# Patient Record
Sex: Female | Born: 2000 | Hispanic: Yes | Marital: Single | Smoking: Never smoker
Health system: Southern US, Community
[De-identification: ages and names within clinical notes are randomized; demographics above are authoritative.]

## PROBLEM LIST (undated history)

## (undated) ENCOUNTER — Emergency Department (HOSPITAL_COMMUNITY): Admission: EM | Payer: BLUE CROSS/BLUE SHIELD | Source: Home / Self Care

## (undated) DIAGNOSIS — J849 Interstitial pulmonary disease, unspecified: Secondary | ICD-10-CM

## (undated) DIAGNOSIS — R21 Rash and other nonspecific skin eruption: Secondary | ICD-10-CM

## (undated) DIAGNOSIS — Z8709 Personal history of other diseases of the respiratory system: Secondary | ICD-10-CM

## (undated) DIAGNOSIS — K1379 Other lesions of oral mucosa: Secondary | ICD-10-CM

---
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
## 2023-01-28 ENCOUNTER — Inpatient Hospital Stay
Admission: RE | Admit: 2023-01-28 | Discharge: 2023-01-28 | Disposition: A | Discharge status: Home / Self Care | Payer: Self-pay | Source: Ambulatory Visit

## 2023-01-28 ENCOUNTER — Ambulatory Visit
Admission: EM | Admit: 2023-01-28 | Discharge: 2023-01-28 | Disposition: A | Discharge status: Home / Self Care | Payer: BLUE CROSS/BLUE SHIELD | Attending: Internal Medicine | Admitting: Internal Medicine

## 2023-01-28 ENCOUNTER — Ambulatory Visit
Admission: RE | Admit: 2023-01-28 | Discharge: 2023-01-28 | Disposition: A | Discharge status: Home / Self Care | Payer: BLUE CROSS/BLUE SHIELD | Source: Ambulatory Visit | Attending: Internal Medicine | Admitting: Internal Medicine

## 2023-01-28 DIAGNOSIS — J020 Streptococcal pharyngitis: Secondary | ICD-10-CM

## 2023-01-28 DIAGNOSIS — R053 Chronic cough: Secondary | ICD-10-CM | POA: Diagnosis present

## 2023-01-28 DIAGNOSIS — J069 Acute upper respiratory infection, unspecified: Secondary | ICD-10-CM | POA: Diagnosis present

## 2023-01-28 DIAGNOSIS — J029 Acute pharyngitis, unspecified: Secondary | ICD-10-CM | POA: Diagnosis present

## 2023-01-28 LAB — POCT RAPID STREP A (OFFICE): Rapid Strep A Screen: NEGATIVE

## 2023-01-28 MED ORDER — ALBUTEROL SULFATE HFA 108 (90 BASE) MCG/ACT IN AERS: 1.0000 | INHALATION_SPRAY | Freq: Four times a day (QID) | RESPIRATORY_TRACT | 0 refills | Status: AC | PRN

## 2023-01-28 MED ORDER — BENZONATATE 100 MG PO CAPS: 100.0000 mg | ORAL_CAPSULE | Freq: Three times a day (TID) | ORAL | 0 refills | Status: DC | PRN

## 2023-01-28 MED ORDER — DOXYCYCLINE HYCLATE 100 MG PO CAPS: 100.0000 mg | ORAL_CAPSULE | Freq: Two times a day (BID) | ORAL | 0 refills | Status: AC

## 2023-01-28 NOTE — Discharge Instructions (Signed)
Strep is negative.  Throat culture is pending.  Will call if it is abnormal.  I have prescribed an antibiotic, cough medication, inhaler to take for symptoms.  Chest x-ray has been ordered at [facility] imaging.  Please go there today to have x-ray completed.  Follow-up with the ER or urgent care if symptoms persist or worsen.

## 2023-01-28 NOTE — ED Provider Notes (Addendum)
[facility]    CSN: [id number] Arrival date & time: 01/28/23  1343      History   Chief Complaint Chief Complaint  Patient presents with   Appointment    1400   Cough    HPI Dominique Robles is a 22 y.o. female.   Patient presents with nasal congestion, sore throat, cough that has been present for about 2 to 3 weeks.  Patient denies any fever or known sick contacts.  Patient has taken over-the-counter Mucinex with minimal improvement in symptoms.  Patient also reporting some shortness of breath and wheezing that is intermittent.  Patient does have a history of interstitial lung disease.  Patient does not use any form of inhaler for the symptoms.  She denies that she smokes cigarettes but does report that she vapes occasionally.   Cough   Past Medical History:  Diagnosis Date   History of asthma    at a young age   Interstitial pneumonia (HCC)    with autoimmune features   Mucocele of lower lip 02/2017   Rash of back 03/04/2017   has appt. to see dermatologist    There are no problems to display for this patient.   Past Surgical History:  Procedure Laterality Date   CHEST TUBE INSERTION Left 11/13/2015   LUNG BIOPSY  11/13/2015   MASS EXCISION N/A 03/11/2017   Procedure: EXCISION MUCOCELE LOWER LIP MUCOSA;  Surgeon: Glenna Fellows, MD;  Location: [facility];  Service: Plastics;  Laterality: N/A;   VIDEO BRONCHOSCOPY WITH ENDOBRONCHIAL ULTRASOUND  07/31/2015    OB History   No obstetric history on file.      Home Medications    Prior to Admission medications   Medication Sig Start Date End Date Taking? Authorizing Provider  albuterol (VENTOLIN HFA) 108 (90 Base) MCG/ACT inhaler Inhale 1-2 puffs into the lungs every 6 (six) hours as needed for wheezing or shortness of breath. 01/28/23  Yes Collen Hostler, Rolly Salter E, FNP  benzonatate (TESSALON) 100 MG capsule Take 1 capsule (100 mg total) by mouth every 8 (eight) hours as needed for  cough. 01/28/23  Yes Channon Brougher, Rolly Salter E, FNP  dextromethorphan-guaiFENesin (MUCINEX DM) 30-600 MG 12hr tablet Take 1 tablet by mouth 2 (two) times daily.   Yes [provider]  doxycycline (VIBRAMYCIN) 100 MG capsule Take 1 capsule (100 mg total) by mouth 2 (two) times daily. 01/28/23  Yes Derris Millan, Rolly Salter E, FNP  etonogestrel (NEXPLANON) 68 MG IMPL implant 1 each by Subdermal route once.   Yes [provider]  azithromycin (ZITHROMAX) 250 MG tablet Take 1 tablet (250 mg total) by mouth daily. Take first 2 tablets together, then 1 every day until finished. 10/24/17   Vicki Mallet, MD  clindamycin (CLEOCIN) 300 MG capsule Take 1 capsule (300 mg total) by mouth 3 (three) times daily. For 5 days 07/11/17   Ree Shay, MD  folic acid (FOLVITE) 1 MG tablet Take 1 mg by mouth daily.    [provider]  hydroxychloroquine (PLAQUENIL) 200 MG tablet Take 200 mg by mouth daily.    [provider]  Lactobacillus Rhamnosus, GG, (CULTURELLE) CAPS Take 1 capsule by mouth 3 (three) times daily. For 5 days 07/11/17   Ree Shay, MD  Methotrexate, PF, 25 MG/0.4ML SOAJ Inject 25 mg into the skin.    [provider]  Multiple Vitamin (MULTIVITAMIN) tablet Take 1 tablet by mouth daily.    [provider]  oseltamivir (TAMIFLU) 75 MG capsule Take  1 capsule (75 mg total) by mouth every 12 (twelve) hours. 10/24/17   Vicki Mallet, MD    Family History Family History  Problem Relation Age of Onset   Diabetes Maternal Grandmother    Hypertension Maternal Grandmother    Diabetes Paternal Grandmother    Diabetes Paternal Grandfather     Social History Social History   Tobacco Use   Smoking status: Never   Smokeless tobacco: Never  Vaping Use   Vaping Use: Some days  Substance Use Topics   Alcohol use: No   Drug use: Yes    Frequency: 1.0 times per week    Types: Marijuana     Allergies   Keflex [cephalexin], Lactose intolerance (gi), Milk (cow), and  Penicillins   Review of Systems Review of Systems Per HPI  Physical Exam Triage Vital Signs ED Triage Vitals  Enc Vitals Group     BP 01/28/23 1409 133/82     Pulse Rate 01/28/23 1409 (!) 103     Resp 01/28/23 1409 18     Temp 01/28/23 1409 98.9 F (37.2 C)     Temp Source 01/28/23 1409 Oral     SpO2 01/28/23 1409 98 %     Weight --      Height --      Head Circumference --      Peak Flow --      Pain Score 01/28/23 1411 6     Pain Loc --      Pain Edu? --      Excl. in GC? --    No data found.  Updated Vital Signs BP 133/82 (BP Location: Left Arm)   Pulse (!) 103   Temp 98.9 F (37.2 C) (Oral)   Resp 18   SpO2 98%   Visual Acuity Right Eye Distance:   Left Eye Distance:   Bilateral Distance:    Right Eye Near:   Left Eye Near:    Bilateral Near:     Physical Exam Constitutional:      General: She is not in acute distress.    Appearance: Normal appearance. She is not toxic-appearing or diaphoretic.  HENT:     Head: Normocephalic and atraumatic.     Right Ear: Tympanic membrane and ear canal normal.     Left Ear: Tympanic membrane and ear canal normal.     Nose: Congestion present.     Mouth/Throat:     Mouth: Mucous membranes are moist.     Pharynx: Posterior oropharyngeal erythema present.  Eyes:     Extraocular Movements: Extraocular movements intact.     Conjunctiva/sclera: Conjunctivae normal.     Pupils: Pupils are equal, round, and reactive to light.  Cardiovascular:     Rate and Rhythm: Normal rate and regular rhythm.     Pulses: Normal pulses.     Heart sounds: Normal heart sounds.  Pulmonary:     Effort: Pulmonary effort is normal. No respiratory distress.     Breath sounds: Normal breath sounds. No stridor. No wheezing, rhonchi or rales.  Abdominal:     General: Abdomen is flat. Bowel sounds are normal.     Palpations: Abdomen is soft.  Musculoskeletal:        General: Normal range of motion.     Cervical back: Normal range of  motion.  Skin:    General: Skin is warm and dry.  Neurological:     General: No focal deficit present.     Mental Status:  She is alert and oriented to person, place, and time. Mental status is at baseline.  Psychiatric:        Mood and Affect: Mood normal.        Behavior: Behavior normal.      UC Treatments / Results  Labs (all labs ordered are listed, but only abnormal results are displayed) Labs Reviewed  CULTURE, GROUP A STREP [facility])  POCT RAPID STREP A (OFFICE)    EKG   Radiology DG Chest 2 View  Result Date: 01/28/2023 CLINICAL DATA:  Cough and congestion EXAM: CHEST - 2 VIEW COMPARISON:  Chest radiograph and CT 10/24/2017 FINDINGS: The cardiomediastinal silhouette is normal A 2.9 cm cavitary lesion in the left upper lobe with layering fluid is unchanged since 2019. There is no other focal airspace opacity. There is no pulmonary edema. There is no pleural effusion or pneumothorax There is no acute osseous abnormality. IMPRESSION: 1. No radiographic evidence of acute cardiopulmonary process. 2. Cavitary lesion in the left upper lobe with layering fluid is unchanged since 2019. Electronically Signed   By: Lesia Hausen M.D.   On: 01/28/2023 15:40    Procedures Procedures (including critical care time)  Medications Ordered in UC Medications - No data to display  Initial Impression / Assessment and Plan / UC Course  I have reviewed the triage vital signs and the nursing notes.  Pertinent labs & imaging results that were available during my care of the patient were reviewed by me and considered in my medical decision making (see chart for details).     I do think that chest imaging is necessary given patient's history of interstitial lung disease and recent shortness of breath.  Do not have x-ray tech here in urgent care today so outpatient imaging was ordered.  Awaiting results.  Patient is not tachypneic, no adventitious lung sounds on exam, and oxygen is normal so do not  think that emergent evaluation is necessary.  Symptoms most likely started off as a viral illness but given duration of symptoms, I am concerned for secondary bacterial infection so will opt to treat with doxycycline antibiotic.  Patient does take methotrexate and sometimes doxycycline can increase methotrexate effectiveness.  Although, according to up-to-date if patient is taking it for rheumatic disease, doxycycline should be safe.  It appears that patient is taking it for rheumatic disease.  Although, encouraged patient to follow-up with her rheumatologist to have levels checked.  Will prescribe benzonatate to take as needed for cough and albuterol inhaler to take as needed for wheezing or shortness of breath.  COVID testing deferred given duration of symptoms as it would not change treatment.  Encouraged strict return and ER precautions.  Patient verbalized understanding and was agreeable with plan.  Patient's rapid strep test was negative and throat culture is pending.  It appears that clinical staff entered streptococcal sore throat in diagnoses and associated it with throat culture.  I am not able to discontinue it and neither is clinical staff but the patient's strep test was negative today. Final Clinical Impressions(s) / UC Diagnoses   Final diagnoses:  Persistent cough  Acute upper respiratory infection  Streptococcal sore throat     Discharge Instructions      Strep is negative.  Throat culture is pending.  Will call if it is abnormal.  I have prescribed an antibiotic, cough medication, inhaler to take for symptoms.  Chest x-ray has been ordered at [facility] imaging.  Please go there today to have x-ray completed.  Follow-up with the ER or urgent care if symptoms persist or worsen.    ED Prescriptions     Medication Sig Dispense Auth. Provider   doxycycline (VIBRAMYCIN) 100 MG capsule Take 1 capsule (100 mg total) by mouth 2 (two) times daily. 20 capsule [place], [place] E, [place]    benzonatate (TESSALON) 100 MG capsule Take 1 capsule (100 mg total) by mouth every 8 (eight) hours as needed for cough. 21 capsule [place], [place] E, [place]   albuterol (VENTOLIN HFA) 108 (90 Base) MCG/ACT inhaler Inhale 1-2 puffs into the lungs every 6 (six) hours as needed for wheezing or shortness of breath. 1 each Gustavus Bryant, [place]      PDMP not reviewed this encounter.   Gustavus Bryant, [place] 01/28/23 1553    Gustavus Bryant, [place] 01/28/23 1554

## 2023-01-28 NOTE — ED Triage Notes (Signed)
Pt reports cough, runny nose, wheezing, shortness of breath, sore throat  x 2-3 weeks. Mucinex and cough drops gives some relief

## 2023-01-29 LAB — CULTURE, GROUP A STREP (THRC)

## 2023-01-30 LAB — CULTURE, GROUP A STREP (THRC)

## 2023-01-31 ENCOUNTER — Telehealth: Payer: Self-pay | Admitting: *Deleted

## 2023-01-31 LAB — CULTURE, GROUP A STREP (THRC)

## 2023-01-31 NOTE — Telephone Encounter (Signed)
Pt states she is returning missed call from the weekend. Chart reviewed; relayed to pt that her CXR did not show any acute processes, and that it was unchanged from her 2019 studies, and that there is no change needed in her care plan. Pt verbalized understanding and states she is feeling a little better.

## 2023-05-20 ENCOUNTER — Ambulatory Visit
Admission: EM | Admit: 2023-05-20 | Discharge: 2023-05-20 | Disposition: A | Discharge status: Home / Self Care | Payer: BLUE CROSS/BLUE SHIELD | Attending: Internal Medicine | Admitting: Internal Medicine

## 2023-05-20 DIAGNOSIS — Z1152 Encounter for screening for COVID-19: Secondary | ICD-10-CM | POA: Insufficient documentation

## 2023-05-20 DIAGNOSIS — J069 Acute upper respiratory infection, unspecified: Secondary | ICD-10-CM

## 2023-05-20 DIAGNOSIS — J029 Acute pharyngitis, unspecified: Secondary | ICD-10-CM

## 2023-05-20 LAB — POCT RAPID STREP A (OFFICE): Rapid Strep A Screen: NEGATIVE

## 2023-05-20 MED ORDER — FLUTICASONE PROPIONATE 50 MCG/ACT NA SUSP: 1.0000 | Freq: Every day | NASAL | 0 refills | Status: AC

## 2023-05-20 MED ORDER — GUAIFENESIN 200 MG PO TABS: 200.0000 mg | ORAL_TABLET | ORAL | 0 refills | Status: AC | PRN

## 2023-05-20 NOTE — Discharge Instructions (Signed)
Your rapid strep test was negative.  Throat culture and COVID test are pending.  As we discussed, it appears that you have a viral illness that should run its course.  I have prescribed you a few medications to help with symptoms.  Follow-up if any symptoms persist or worsen.  Ensure adequate fluid hydration.

## 2023-05-20 NOTE — ED Triage Notes (Signed)
Pt states cough and congestion for the past 2 days. States she has been taking Nyquil and Dayquil at home with some relief.

## 2023-05-20 NOTE — ED Provider Notes (Signed)
[facility]    CSN: [id number] Arrival date & time: 05/20/23  1131      History   Chief Complaint Chief Complaint  Patient presents with   Cough    HPI Khiley Lieser is a 22 y.o. female.   Patient presents with cough, nasal congestion, and sore throat that started 2 days ago.  She has taken NyQuil and DayQuil for symptoms with temporary improvement.  Reports known sick contact with similar symptoms.  Reports body aches and chills but has not taken temperature with thermometer.  Reports history of asthma in childhood.  Denies chest pain or shortness of breath.   Cough   Past Medical History:  Diagnosis Date   History of asthma    at a young age   Interstitial pneumonia (HCC)    with autoimmune features   Mucocele of lower lip 02/2017   Rash of back 03/04/2017   has appt. to see dermatologist    There are no problems to display for this patient.   Past Surgical History:  Procedure Laterality Date   CHEST TUBE INSERTION Left 11/13/2015   LUNG BIOPSY  11/13/2015   MASS EXCISION N/A 03/11/2017   Procedure: EXCISION MUCOCELE LOWER LIP MUCOSA;  Surgeon: Glenna Fellows, MD;  Location: [facility];  Service: Plastics;  Laterality: N/A;   VIDEO BRONCHOSCOPY WITH ENDOBRONCHIAL ULTRASOUND  07/31/2015    OB History   No obstetric history on file.      Home Medications    Prior to Admission medications   Medication Sig Start Date End Date Taking? Authorizing Provider  fluticasone (FLONASE) 50 MCG/ACT nasal spray Place 1 spray into both nostrils daily. 05/20/23  Yes Sidney Kann, Rolly Salter E, FNP  guaiFENesin 200 MG tablet Take 1 tablet (200 mg total) by mouth every 4 (four) hours as needed for cough or to loosen phlegm. 05/20/23  Yes Krystall Kruckenberg, Acie Fredrickson, FNP  albuterol (VENTOLIN HFA) 108 (90 Base) MCG/ACT inhaler Inhale 1-2 puffs into the lungs every 6 (six) hours as needed for wheezing or shortness of breath. 01/28/23   Gustavus Bryant, FNP   azithromycin (ZITHROMAX) 250 MG tablet Take 1 tablet (250 mg total) by mouth daily. Take first 2 tablets together, then 1 every day until finished. 10/24/17   Vicki Mallet, MD  clindamycin (CLEOCIN) 300 MG capsule Take 1 capsule (300 mg total) by mouth 3 (three) times daily. For 5 days 07/11/17   Ree Shay, MD  dextromethorphan-guaiFENesin [facility]) 30-600 MG 12hr tablet Take 1 tablet by mouth 2 (two) times daily.    [provider]  doxycycline (VIBRAMYCIN) 100 MG capsule Take 1 capsule (100 mg total) by mouth 2 (two) times daily. 01/28/23   Gustavus Bryant, FNP  etonogestrel (NEXPLANON) 68 MG IMPL implant 1 each by Subdermal route once.    [provider]  folic acid (FOLVITE) 1 MG tablet Take 1 mg by mouth daily.    [provider]  hydroxychloroquine (PLAQUENIL) 200 MG tablet Take 200 mg by mouth daily.    [provider]  Lactobacillus Rhamnosus, GG, (CULTURELLE) CAPS Take 1 capsule by mouth 3 (three) times daily. For 5 days 07/11/17   Ree Shay, MD  Methotrexate, PF, 25 MG/0.4ML SOAJ Inject 25 mg into the skin.    [provider]  Multiple Vitamin (MULTIVITAMIN) tablet Take 1 tablet by mouth daily.    [provider]  oseltamivir (TAMIFLU) 75 MG capsule Take 1 capsule (75 mg total) by mouth every  12 (twelve) hours. 10/24/17   Vicki Mallet, MD    Family History Family History  Problem Relation Age of Onset   Diabetes Maternal Grandmother    Hypertension Maternal Grandmother    Diabetes Paternal Grandmother    Diabetes Paternal Grandfather     Social History Social History   Tobacco Use   Smoking status: Never   Smokeless tobacco: Never  Vaping Use   Vaping status: Some Days  Substance Use Topics   Alcohol use: No   Drug use: Yes    Frequency: 1.0 times per week    Types: Marijuana     Allergies   Keflex [cephalexin], Lactose intolerance (gi), Milk (cow), and Penicillins   Review of Systems Review of  Systems Per HPI  Physical Exam Triage Vital Signs ED Triage Vitals  Encounter Vitals Group     BP 05/20/23 1202 131/88     Systolic BP Percentile --      Diastolic BP Percentile --      Pulse Rate 05/20/23 1202 94     Resp 05/20/23 1202 16     Temp 05/20/23 1202 98.7 F (37.1 C)     Temp Source 05/20/23 1202 Oral     SpO2 05/20/23 1202 96 %     Weight --      Height --      Head Circumference --      Peak Flow --      Pain Score 05/20/23 1203 0     Pain Loc --      Pain Education --      Exclude from Growth Chart --    No data found.  Updated Vital Signs BP 131/88 (BP Location: Left Arm)   Pulse 94   Temp 98.7 F (37.1 C) (Oral)   Resp 16   LMP 04/29/2023 (Approximate)   SpO2 96%   Visual Acuity Right Eye Distance:   Left Eye Distance:   Bilateral Distance:    Right Eye Near:   Left Eye Near:    Bilateral Near:     Physical Exam Constitutional:      General: She is not in acute distress.    Appearance: Normal appearance. She is not toxic-appearing or diaphoretic.  HENT:     Head: Normocephalic and atraumatic.     Right Ear: Tympanic membrane and ear canal normal.     Left Ear: Tympanic membrane and ear canal normal.     Nose: Congestion present.     Mouth/Throat:     Mouth: Mucous membranes are moist.     Pharynx: Posterior oropharyngeal erythema present.  Eyes:     Extraocular Movements: Extraocular movements intact.     Conjunctiva/sclera: Conjunctivae normal.     Pupils: Pupils are equal, round, and reactive to light.  Cardiovascular:     Rate and Rhythm: Normal rate and regular rhythm.     Pulses: Normal pulses.     Heart sounds: Normal heart sounds.  Pulmonary:     Effort: Pulmonary effort is normal. No respiratory distress.     Breath sounds: Normal breath sounds. No stridor. No wheezing, rhonchi or rales.  Abdominal:     General: Abdomen is flat. Bowel sounds are normal.     Palpations: Abdomen is soft.  Musculoskeletal:        General:  Normal range of motion.     Cervical back: Normal range of motion.  Skin:    General: Skin is warm and dry.  Neurological:  General: No focal deficit present.     Mental Status: She is alert and oriented to person, place, and time. Mental status is at baseline.  Psychiatric:        Mood and Affect: Mood normal.        Behavior: Behavior normal.      UC Treatments / Results  Labs (all labs ordered are listed, but only abnormal results are displayed) Labs Reviewed  CULTURE, GROUP A STREP ([facility])  SARS CORONAVIRUS 2 (TAT 6-24 HRS)  POCT RAPID STREP A (OFFICE)    EKG   Radiology No results found.  Procedures Procedures (including critical care time)  Medications Ordered in UC Medications - No data to display  Initial Impression / Assessment and Plan / UC Course  I have reviewed the triage vital signs and the nursing notes.  Pertinent labs & imaging results that were available during my care of the patient were reviewed by me and considered in my medical decision making (see chart for details).     Patient presents with symptoms likely from a viral upper respiratory infection. Do not suspect underlying cardiopulmonary process. Symptoms seem unlikely related to ACS, CHF or COPD exacerbations, pneumonia, pneumothorax. Patient is nontoxic appearing and not in need of emergent medical intervention.  Rapid strep is negative.  Throat culture and COVID test pending.  Recommended symptom control with medications and supportive care.  Patient was sent prescriptions.  Return if symptoms fail to improve in 1-2 weeks or you develop shortness of breath, chest pain, severe headache. Patient states understanding and is agreeable.  Discharged with PCP followup.  Final Clinical Impressions(s) / UC Diagnoses   Final diagnoses:  Viral upper respiratory tract infection with cough  Sore throat     Discharge Instructions      Your rapid strep test was negative.  Throat culture and  COVID test are pending.  As we discussed, it appears that you have a viral illness that should run its course.  I have prescribed you a few medications to help with symptoms.  Follow-up if any symptoms persist or worsen.  Ensure adequate fluid hydration.     ED Prescriptions     Medication Sig Dispense Auth. Provider   guaiFENesin 200 MG tablet Take 1 tablet (200 mg total) by mouth every 4 (four) hours as needed for cough or to loosen phlegm. 30 tablet [place], [place] E, [place]   fluticasone [facility]) 50 MCG/ACT nasal spray Place 1 spray into both nostrils daily. 16 g Gustavus Bryant, [place]      PDMP not reviewed this encounter.   Gustavus Bryant, [place] 05/20/23 1243

## 2023-05-21 LAB — SARS CORONAVIRUS 2 (TAT 6-24 HRS): SARS Coronavirus 2: NEGATIVE

## 2023-05-23 LAB — CULTURE, GROUP A STREP (THRC)

## 2023-11-11 ENCOUNTER — Encounter: Payer: BLUE CROSS/BLUE SHIELD | Admitting: Family

## 2023-11-11 NOTE — Progress Notes (Signed)
 Erroneous encounter-disregard

## 2024-03-10 ENCOUNTER — Encounter (HOSPITAL_COMMUNITY): Payer: Self-pay | Admitting: Emergency Medicine

## 2024-03-10 ENCOUNTER — Emergency Department (HOSPITAL_COMMUNITY)

## 2024-03-10 ENCOUNTER — Emergency Department (HOSPITAL_COMMUNITY): Admission: EM | Admit: 2024-03-10 | Discharge: 2024-03-10 | Disposition: A | Discharge status: Home / Self Care

## 2024-03-10 ENCOUNTER — Other Ambulatory Visit: Payer: Self-pay

## 2024-03-10 DIAGNOSIS — R110 Nausea: Secondary | ICD-10-CM | POA: Insufficient documentation

## 2024-03-10 DIAGNOSIS — R109 Unspecified abdominal pain: Secondary | ICD-10-CM | POA: Diagnosis present

## 2024-03-10 DIAGNOSIS — R197 Diarrhea, unspecified: Secondary | ICD-10-CM | POA: Diagnosis not present

## 2024-03-10 DIAGNOSIS — J45909 Unspecified asthma, uncomplicated: Secondary | ICD-10-CM | POA: Insufficient documentation

## 2024-03-10 DIAGNOSIS — R1084 Generalized abdominal pain: Secondary | ICD-10-CM | POA: Insufficient documentation

## 2024-03-10 LAB — COMPREHENSIVE METABOLIC PANEL WITH GFR
ALT: 26 U/L (ref 0–44)
AST: 22 U/L (ref 15–41)
Albumin: 5 g/dL (ref 3.5–5.0)
Alkaline Phosphatase: 51 U/L (ref 38–126)
Anion gap: 11 (ref 5–15)
BUN: 10 mg/dL (ref 6–20)
CO2: 25 mmol/L (ref 22–32)
Calcium: 9.8 mg/dL (ref 8.9–10.3)
Chloride: 101 mmol/L (ref 98–111)
Creatinine, Ser: 0.62 mg/dL (ref 0.44–1.00)
GFR, Estimated: >60 mL/min (ref >60)
Glucose, Bld: 121 mg/dL — ABNORMAL HIGH (ref 70–99)
Potassium: 3.9 mmol/L (ref 3.5–5.1)
Sodium: 137 mmol/L (ref 135–145)
Total Bilirubin: 0.7 mg/dL (ref 0.0–1.2)
Total Protein: 9.2 g/dL — ABNORMAL HIGH (ref 6.5–8.1)

## 2024-03-10 LAB — URINALYSIS, ROUTINE W REFLEX MICROSCOPIC
Bacteria, UA: NONE SEEN
Bilirubin Urine: NEGATIVE
Glucose, UA: NEGATIVE mg/dL
Ketones, ur: NEGATIVE mg/dL
Leukocytes,Ua: NEGATIVE
Nitrite: NEGATIVE
Protein, ur: NEGATIVE mg/dL
Specific Gravity, Urine: 1.01 (ref 1.005–1.030)
pH: 8 (ref 5.0–8.0)

## 2024-03-10 LAB — CBC
HCT: 41.7 % (ref 36.0–46.0)
Hemoglobin: 13.7 g/dL (ref 12.0–15.0)
MCH: 29.5 pg (ref 26.0–34.0)
MCHC: 32.9 g/dL (ref 30.0–36.0)
MCV: 89.9 fL (ref 80.0–100.0)
Platelets: 429 K/uL — ABNORMAL HIGH (ref 150–400)
RBC: 4.64 MIL/uL (ref 3.87–5.11)
RDW: 12.5 % (ref 11.5–15.5)
WBC: 10.6 K/uL — ABNORMAL HIGH (ref 4.0–10.5)
nRBC: 0 % (ref 0.0–0.2)

## 2024-03-10 LAB — LIPASE, BLOOD: Lipase: 31 U/L (ref 11–51)

## 2024-03-10 LAB — HCG, SERUM, QUALITATIVE: Preg, Serum: NEGATIVE

## 2024-03-10 MED ORDER — ALUM & MAG HYDROXIDE-SIMETH 200-200-20 MG/5ML PO SUSP
30.0000 mL | Freq: Once | ORAL | Status: AC
  Administered 2024-03-10: 30 mL via ORAL
  Filled 2024-03-10: qty 30

## 2024-03-10 MED ORDER — IOHEXOL 300 MG/ML  SOLN
100.0000 mL | Freq: Once | INTRAMUSCULAR | Status: AC | PRN
Start: 2024-03-10 — End: 2024-03-10
  Administered 2024-03-10: 100 mL via INTRAVENOUS

## 2024-03-10 MED ORDER — SODIUM CHLORIDE 0.9 % IV BOLUS
1000.0000 mL | Freq: Once | INTRAVENOUS | Status: AC
  Administered 2024-03-10: 1000 mL via INTRAVENOUS

## 2024-03-10 MED ORDER — DICYCLOMINE HCL 10 MG PO CAPS
10.0000 mg | ORAL_CAPSULE | Freq: Once | ORAL | Status: AC
  Administered 2024-03-10: 10 mg via ORAL
  Filled 2024-03-10: qty 1

## 2024-03-10 MED ORDER — ONDANSETRON HCL 4 MG/2ML IJ SOLN
4.0000 mg | Freq: Once | INTRAMUSCULAR | Status: AC
Start: 2024-03-10 — End: 2024-03-10
  Administered 2024-03-10: 4 mg via INTRAVENOUS
  Filled 2024-03-10: qty 2

## 2024-03-10 MED ORDER — DICYCLOMINE HCL 20 MG PO TABS: 20.0000 mg | ORAL_TABLET | Freq: Two times a day (BID) | ORAL | 0 refills | Status: AC

## 2024-03-10 MED ORDER — PROMETHAZINE HCL 25 MG PO TABS: 25.0000 mg | ORAL_TABLET | Freq: Four times a day (QID) | ORAL | 0 refills | Status: AC | PRN

## 2024-03-10 NOTE — ED Triage Notes (Signed)
  Patient comes in with abdominal pain that has been going on for about a week.  Endorses nausea and diarrhea.  Has been taking zofran  since 7/21 for nausea.  No solid BM for about a week.  Also taking gas x, tums, and drinking lemon water.  Pain 8/10, sharp.

## 2024-03-10 NOTE — ED Provider Notes (Signed)
 [place] EMERGENCY DEPARTMENT AT [facility] Provider Note   CSN: [id number] Arrival date & time: 03/10/24  9451     Patient presents with: Abdominal Pain and Constipation   Dominique Robles is a 23 y.o. female.   Patient with multiple multiple medical history presents today with complaints of abdominal pain.  She reports that symptoms been ongoing for the last 1 week.  She endorses nausea without vomiting and with diarrhea.  Denies hematochezia or melena. No known sick contacts.  Pain is generalized throughout her abdomen and does not radiate.  She has been taking Gas-X and Tums with minimal improvement, does feel like drinking water makes it better, reports eating makes pain worse.  Reports that she went to urgent care on Monday when she started having the symptoms and was given Zofran  which she has been taking but does not feel like it is giving her substantial improvement in her nausea.  Denies any history of similar symptoms previously.  No history of abdominal surgeries.  No fevers or chills.  No urinary symptoms.  She is currently on her menstrual cycle, it is at her normal time for her and does not feel heavier than normal.  These pains do not feel like her menstrual pain.  The history is provided by the patient. No language interpreter was used.  Abdominal Pain Associated symptoms: constipation, diarrhea and nausea   Constipation Associated symptoms: abdominal pain, diarrhea and nausea        Prior to Admission medications   Medication Sig Start Date End Date Taking? Authorizing Provider  albuterol  (VENTOLIN  HFA) 108 (90 Base) MCG/ACT inhaler Inhale 1-2 puffs into the lungs every 6 (six) hours as needed for wheezing or shortness of breath. 01/28/23   Hazen Darryle BRAVO, FNP  azithromycin  (ZITHROMAX ) 250 MG tablet Take 1 tablet (250 mg total) by mouth daily. Take first 2 tablets together, then 1 every day until finished. 10/24/17   Merita Delon POUR, MD  clindamycin   (CLEOCIN ) 300 MG capsule Take 1 capsule (300 mg total) by mouth 3 (three) times daily. For 5 days 07/11/17   Susy Pierce, MD  dextromethorphan-guaiFENesin  (MUCINEX  DM) 30-600 MG 12hr tablet Take 1 tablet by mouth 2 (two) times daily.    [provider]  doxycycline  (VIBRAMYCIN ) 100 MG capsule Take 1 capsule (100 mg total) by mouth 2 (two) times daily. 01/28/23   Hazen Darryle BRAVO, FNP  etonogestrel (NEXPLANON) 68 MG IMPL implant 1 each by Subdermal route once.    [provider]  fluticasone  (FLONASE ) 50 MCG/ACT nasal spray Place 1 spray into both nostrils daily. 05/20/23   Hazen Darryle BRAVO, FNP  folic acid (FOLVITE) 1 MG tablet Take 1 mg by mouth daily.    [provider]  guaiFENesin  200 MG tablet Take 1 tablet (200 mg total) by mouth every 4 (four) hours as needed for cough or to loosen phlegm. 05/20/23   Hazen Darryle BRAVO, FNP  hydroxychloroquine (PLAQUENIL) 200 MG tablet Take 200 mg by mouth daily.    [provider]  Lactobacillus Rhamnosus, GG, (CULTURELLE) CAPS Take 1 capsule by mouth 3 (three) times daily. For 5 days 07/11/17   Deis, Jamie, MD  Methotrexate, PF, 25 MG/0.4ML SOAJ Inject 25 mg into the skin.    [provider]  Multiple Vitamin (MULTIVITAMIN) tablet Take 1 tablet by mouth daily.    [provider]  oseltamivir  (TAMIFLU ) 75 MG capsule Take 1 capsule (75 mg total) by mouth every 12 (twelve) hours. 10/24/17  Merita Delon POUR, MD    Allergies: Keflex [cephalexin], Lactose intolerance (gi), Milk (cow), and Penicillins    Review of Systems  Gastrointestinal:  Positive for abdominal pain, diarrhea and nausea.  All other systems reviewed and are negative.   Updated Vital Signs BP (!) 151/108 (BP Location: Right Arm)   Pulse 95   Temp 98.4 F (36.9 C) (Oral)   Resp 18   Ht 5' 4 (1.626 m)   Wt 61.2 kg   SpO2 100%   BMI 23.17 kg/m   Physical Exam Vitals and nursing note reviewed.  Constitutional:      General: She is not  in acute distress.    Appearance: Normal appearance. She is normal weight. She is not ill-appearing, toxic-appearing or diaphoretic.  HENT:     Head: Normocephalic and atraumatic.  Cardiovascular:     Rate and Rhythm: Normal rate.  Pulmonary:     Effort: Pulmonary effort is normal. No respiratory distress.  Abdominal:     General: Abdomen is flat.     Palpations: Abdomen is soft.     Tenderness: There is generalized abdominal tenderness. There is no guarding or rebound.  Musculoskeletal:        General: Normal range of motion.     Cervical back: Normal range of motion.  Skin:    General: Skin is warm and dry.  Neurological:     General: No focal deficit present.     Mental Status: She is alert.  Psychiatric:        Mood and Affect: Mood normal.        Behavior: Behavior normal.     (all labs ordered are listed, but only abnormal results are displayed) Labs Reviewed  COMPREHENSIVE METABOLIC PANEL WITH GFR - Abnormal; Notable for the following components:      Result Value   Glucose, Bld 121 (*)    Total Protein 9.2 (*)    All other components within normal limits  CBC - Abnormal; Notable for the following components:   WBC 10.6 (*)    Platelets 429 (*)    All other components within normal limits  URINALYSIS, ROUTINE W REFLEX MICROSCOPIC - Abnormal; Notable for the following components:   Color, Urine STRAW (*)    Hgb urine dipstick SMALL (*)    All other components within normal limits  LIPASE, BLOOD  HCG, SERUM, QUALITATIVE    EKG: None  Radiology: No results found.   Procedures   Medications Ordered in the ED  ondansetron  (ZOFRAN ) injection 4 mg (4 mg Intravenous Given 03/10/24 0808)  sodium chloride  0.9 % bolus 1,000 mL (1,000 mLs Intravenous New Bag/Given 03/10/24 0811)  iohexol  (OMNIPAQUE ) 300 MG/ML solution 100 mL (100 mLs Intravenous Contrast Given 03/10/24 0905)                                    Medical Decision Making Amount and/or Complexity of  Data Reviewed Labs: ordered. Radiology: ordered.  Risk Prescription drug management.   This patient is a 23 y.o. female who presents to the ED for concern of abdominal pain, this involves an extensive number of treatment options, and is a complaint that carries with it a high risk of complications and morbidity. The emergent differential diagnosis prior to evaluation includes, but is not limited to,  The differential diagnosis for generalized abdominal pain includes, but is not limited to AAA, gastroenteritis, appendicitis, Bowel obstruction,  Bowel perforation. Gastroparesis, DKA, Hernia, Inflammatory bowel disease, mesenteric ischemia, pancreatitis, peritonitis SBP, volvulus.  This is not an exhaustive differential.   Past Medical History / Co-morbidities / Social History:  has a past medical history of History of asthma, Interstitial pneumonia (HCC), Mucocele of lower lip (02/2017), and Rash of back (03/04/2017).  Additional history: Chart reviewed.  Physical Exam: Physical exam performed. The pertinent findings include: overall well appearing, mild generalized abdominal TTP without rebound or guarding   Lab Tests: I ordered, and personally interpreted labs.  The pertinent results include:  WBC 10.6, UA with hgb, likely due to patients menstrual cycle. No other acute laboratory abnormalities   Imaging Studies: I ordered imaging studies including CT abdomen pelvis. I independently visualized and interpreted imaging which showed   1. No acute findings in the abdomen or pelvis. 2. Mild diffuse hepatic steatosis.   I agree with the radiologist interpretation.   Medications: I ordered medication including zofran , fluids, GI cocktail, bentyl   for pain, nausea, dehydration. Reevaluation of the patient after these medicines showed that the patient improved. I have reviewed the patients home medicines and have made adjustments as needed.  Disposition: After consideration of the  diagnostic results and the patients response to treatment, I feel that emergency department workup does not suggest an emergent condition requiring admission or immediate intervention beyond what has been performed at this time. The plan is: Patient is nontoxic, nonseptic appearing, in no apparent distress.  Patient's pain and other symptoms adequately managed in emergency department.  Fluid bolus given.  Labs, imaging and vitals reviewed.  Patient does not meet the SIRS or Sepsis criteria.  On repeat exam patient does not have a surgical abdomin and there are no peritoneal signs.  No indication of appendicitis, bowel obstruction, bowel perforation, cholecystitis, diverticulitis, PID or ectopic pregnancy.  Patient discharged home with phenergan  and bentyl  for symptomatic treatment and given strict instructions for follow-up with their primary care physician.  I have also discussed reasons to return immediately to the ER.  Patient expresses understanding and agrees with plan.  Evaluation and diagnostic testing in the emergency department does not suggest an emergent condition requiring admission or immediate intervention beyond what has been performed at this time.  Plan for discharge with close PCP follow-up.  Patient is understanding and amenable with plan, educated on red flag symptoms that would prompt immediate return.  Patient discharged in stable condition.  Final diagnoses:  Generalized abdominal pain  Nausea  Diarrhea, unspecified type    ED Discharge Orders          Ordered    promethazine  (PHENERGAN ) 25 MG tablet  Every 6 hours PRN        03/10/24 0953    dicyclomine  (BENTYL ) 20 MG tablet  2 times daily        03/10/24 9046          An After Visit Summary was printed and given to the patient.      Astra Gregg A, PA-C 03/10/24 9045    Simon Lavonia SAILOR, MD 03/10/24 [phone]

## 2024-03-10 NOTE — Discharge Instructions (Signed)
 As we discussed, your workup in the ER today was reassuring for acute findings.  Laboratory evaluation and CT imaging did not reveal any emergent cause of your symptoms.  I have given you a prescription for Phenergan  which you can take as prescribed as needed for any residual nausea or vomiting.  I also given you Bentyl  which you can take for stomach cramps.  Please call your PCP to schedule a close follow-up appointment.  When you go to pick up the prescriptions, recommend that you get a probiotic that you get over-the-counter to help manage your symptoms as well.  Return if development of any new or worsening symptoms.
